# Patient Record
Sex: Male | Born: 1975 | Race: White | Hispanic: No | Smoking: Current every day smoker
Health system: Southern US, Community
[De-identification: ages and names within clinical notes are randomized; demographics above are authoritative.]

## PROBLEM LIST (undated history)

## (undated) DIAGNOSIS — W3400XA Accidental discharge from unspecified firearms or gun, initial encounter: Secondary | ICD-10-CM

## (undated) DIAGNOSIS — S0193XA Puncture wound without foreign body of unspecified part of head, initial encounter: Secondary | ICD-10-CM

## (undated) DIAGNOSIS — F69 Unspecified disorder of adult personality and behavior: Secondary | ICD-10-CM

---

## 2015-04-20 ENCOUNTER — Encounter (HOSPITAL_COMMUNITY): Payer: Self-pay

## 2015-04-20 ENCOUNTER — Emergency Department (HOSPITAL_COMMUNITY)
Admission: EM | Admit: 2015-04-20 | Discharge: 2015-04-20 | Disposition: A | Discharge status: Home / Self Care | Payer: Self-pay | Attending: Emergency Medicine | Admitting: Emergency Medicine

## 2015-04-20 ENCOUNTER — Emergency Department (HOSPITAL_COMMUNITY): Payer: Self-pay

## 2015-04-20 DIAGNOSIS — R569 Unspecified convulsions: Secondary | ICD-10-CM

## 2015-04-20 DIAGNOSIS — W228XXA Striking against or struck by other objects, initial encounter: Secondary | ICD-10-CM | POA: Insufficient documentation

## 2015-04-20 DIAGNOSIS — Z87828 Personal history of other (healed) physical injury and trauma: Secondary | ICD-10-CM | POA: Insufficient documentation

## 2015-04-20 DIAGNOSIS — Y998 Other external cause status: Secondary | ICD-10-CM | POA: Insufficient documentation

## 2015-04-20 DIAGNOSIS — Z720 Tobacco use: Secondary | ICD-10-CM | POA: Insufficient documentation

## 2015-04-20 DIAGNOSIS — Y9289 Other specified places as the place of occurrence of the external cause: Secondary | ICD-10-CM | POA: Insufficient documentation

## 2015-04-20 DIAGNOSIS — Z79899 Other long term (current) drug therapy: Secondary | ICD-10-CM | POA: Insufficient documentation

## 2015-04-20 DIAGNOSIS — R531 Weakness: Secondary | ICD-10-CM | POA: Insufficient documentation

## 2015-04-20 DIAGNOSIS — F69 Unspecified disorder of adult personality and behavior: Secondary | ICD-10-CM | POA: Insufficient documentation

## 2015-04-20 DIAGNOSIS — Y9389 Activity, other specified: Secondary | ICD-10-CM | POA: Insufficient documentation

## 2015-04-20 DIAGNOSIS — G40909 Epilepsy, unspecified, not intractable, without status epilepticus: Secondary | ICD-10-CM | POA: Insufficient documentation

## 2015-04-20 DIAGNOSIS — S0990XA Unspecified injury of head, initial encounter: Secondary | ICD-10-CM | POA: Insufficient documentation

## 2015-04-20 HISTORY — DX: Puncture wound without foreign body of unspecified part of head, initial encounter: S01.93XA

## 2015-04-20 HISTORY — DX: Unspecified disorder of adult personality and behavior: F69

## 2015-04-20 HISTORY — DX: Accidental discharge from unspecified firearms or gun, initial encounter: W34.00XA

## 2015-04-20 LAB — RAPID URINE DRUG SCREEN, HOSP PERFORMED
Amphetamines: NOT DETECTED
Barbiturates: NOT DETECTED
Benzodiazepines: NOT DETECTED
Cocaine: NOT DETECTED
Opiates: NOT DETECTED
Tetrahydrocannabinol: NOT DETECTED

## 2015-04-20 LAB — CBG MONITORING, ED: Glucose-Capillary: 114 mg/dL — ABNORMAL HIGH (ref 65–99)

## 2015-04-20 LAB — CBC WITH DIFFERENTIAL/PLATELET
Basophils Absolute: 0 10*3/uL (ref 0.0–0.1)
Basophils Relative: 0 % (ref 0–1)
Eosinophils Absolute: 0 10*3/uL (ref 0.0–0.7)
Eosinophils Relative: 0 % (ref 0–5)
HCT: 46 % (ref 39.0–52.0)
Hemoglobin: 16.2 g/dL (ref 13.0–17.0)
Lymphocytes Relative: 15 % (ref 12–46)
Lymphs Abs: 1.7 10*3/uL (ref 0.7–4.0)
MCH: 32.2 pg (ref 26.0–34.0)
MCHC: 35.2 g/dL (ref 30.0–36.0)
MCV: 91.5 fL (ref 78.0–100.0)
Monocytes Absolute: 0.5 10*3/uL (ref 0.1–1.0)
Monocytes Relative: 5 % (ref 3–12)
Neutro Abs: 9.2 10*3/uL — ABNORMAL HIGH (ref 1.7–7.7)
Neutrophils Relative %: 80 % — ABNORMAL HIGH (ref 43–77)
Platelets: 271 10*3/uL (ref 150–400)
RBC: 5.03 MIL/uL (ref 4.22–5.81)
RDW: 13.2 % (ref 11.5–15.5)
WBC: 11.4 10*3/uL — ABNORMAL HIGH (ref 4.0–10.5)

## 2015-04-20 LAB — BASIC METABOLIC PANEL
Anion gap: 9 (ref 5–15)
BUN: 10 mg/dL (ref 6–20)
CO2: 22 mmol/L (ref 22–32)
Calcium: 9.2 mg/dL (ref 8.9–10.3)
Chloride: 112 mmol/L — ABNORMAL HIGH (ref 101–111)
Creatinine, Ser: 0.71 mg/dL (ref 0.61–1.24)
GFR calc Af Amer: >60 mL/min (ref >60)
GFR calc non Af Amer: >60 mL/min (ref >60)
Glucose, Bld: 115 mg/dL — ABNORMAL HIGH (ref 65–99)
Potassium: 3.7 mmol/L (ref 3.5–5.1)
Sodium: 143 mmol/L (ref 135–145)

## 2015-04-20 LAB — ETHANOL: Alcohol, Ethyl (B): <5 mg/dL (ref <5)

## 2015-04-20 MED ORDER — LEVETIRACETAM 500 MG PO TABS
500.0000 mg | ORAL_TABLET | Freq: Once | ORAL | Status: AC
  Administered 2015-04-20: 500 mg via ORAL
  Filled 2015-04-20: qty 1

## 2015-04-20 MED ORDER — LEVETIRACETAM 500 MG PO TABS: 500.0000 mg | ORAL_TABLET | Freq: Two times a day (BID) | ORAL | Status: AC

## 2015-04-20 NOTE — Discharge Instructions (Signed)
Seizure, Adult °A seizure is abnormal electrical activity in the brain. Seizures usually last from 30 seconds to 2 minutes. There are various types of seizures. °Before a seizure, you may have a warning sensation (aura) that a seizure is about to occur. An aura may include the following symptoms:  °· Fear or anxiety. °· Nausea. °· Feeling like the room is spinning (vertigo). °· Vision changes, such as seeing flashing lights or spots. °Common symptoms during a seizure include: °· A change in attention or behavior (altered mental status). °· Convulsions with rhythmic jerking movements. °· Drooling. °· Rapid eye movements. °· Grunting. °· Loss of bladder and bowel control. °· Bitter taste in the mouth. °· Tongue biting. °After a seizure, you may feel confused and sleepy. You may also have an injury resulting from convulsions during the seizure. °HOME CARE INSTRUCTIONS  °· If you are given medicines, take them exactly as prescribed by your health care provider. °· Keep all follow-up appointments as directed by your health care provider. °· Do not swim or drive or engage in risky activity during which a seizure could cause further injury to you or others until your health care provider says it is OK. °· Get adequate rest. °· Teach friends and family what to do if you have a seizure. They should: °· Lay you on the ground to prevent a fall. °· Put a cushion under your head. °· Loosen any tight clothing around your neck. °· Turn you on your side. If vomiting occurs, this helps keep your airway clear. °· Stay with you until you recover. °· Know whether or not you need emergency care. °SEEK IMMEDIATE MEDICAL CARE IF: °· The seizure lasts longer than 5 minutes. °· The seizure is severe or you do not wake up immediately after the seizure. °· You have an altered mental status after the seizure. °· You are having more frequent or worsening seizures. °Someone should drive you to the emergency department or call local emergency  services (911 in [place] °MAKE SURE YOU: °· Understand these instructions. °· Will watch your condition. °· Will get help right away if you are not doing well or get worse. °Document Released: 10/29/2000 Document Revised: 08/22/2013 Document Reviewed: 06/13/2013 °Exit[employer]® Patient Information ©2015 [employer]. This information is not intended to replace advice given to you by your health care provider. Make sure you discuss any questions you have with your health care provider. ° °Driving and Equipment Restrictions °Some medical problems make it dangerous to drive, ride a bike, or use machines. Some of these problems are: °· A hard blow to the head (concussion). °· Passing out (fainting). °· Twitching and shaking (seizures). °· Low blood sugar. °· Taking medicine to help you relax (sedatives). °· Taking pain medicines. °· Wearing an eye patch. °· Wearing splints. This can make it hard to use parts of your body that you need to drive safely. °HOME CARE  °· Do not drive until your doctor says it is okay. °· Do not use machines until your doctor says it is okay. °You may need a form signed by your doctor (medical release) before you can drive again. You may also need this form before you do other tasks where you need to be fully alert. °MAKE SURE YOU: °· Understand these instructions. °· Will watch your condition. °· Will get help right away if you are not doing well or get worse. °Document Released: 12/09/2004 Document Revised: 01/24/2012 Document Reviewed: 03/11/2010 °Exit[employer]® Patient Information ©2015 [employer].   This information is not intended to replace advice given to you by your health care provider. Make sure you discuss any questions you have with your health care provider. ° °
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
## 2015-04-20 NOTE — ED Notes (Signed)
Patient transported to CT 
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
## 2015-04-20 NOTE — ED Notes (Signed)
Bed: RESB Expected date: 04/20/15 Expected time: 9:56 AM Means of arrival: Ambulance Comments: ? seizure
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
